# Patient Record
Sex: Male | Born: 2000 | Race: White | Hispanic: No | Marital: Single | Smoking: Never smoker
Health system: Southern US, Community
[De-identification: ages and names within clinical notes are randomized; demographics above are authoritative.]

## PROBLEM LIST (undated history)

## (undated) DIAGNOSIS — F840 Autistic disorder: Secondary | ICD-10-CM

## (undated) DIAGNOSIS — F32A Depression, unspecified: Secondary | ICD-10-CM

## (undated) DIAGNOSIS — F329 Major depressive disorder, single episode, unspecified: Secondary | ICD-10-CM

## (undated) DIAGNOSIS — J45909 Unspecified asthma, uncomplicated: Secondary | ICD-10-CM

## (undated) DIAGNOSIS — F419 Anxiety disorder, unspecified: Secondary | ICD-10-CM

## (undated) HISTORY — DX: Major depressive disorder, single episode, unspecified: F32.9

## (undated) HISTORY — PX: EXTERNAL EAR SURGERY: SHX627

## (undated) HISTORY — DX: Anxiety disorder, unspecified: F41.9

## (undated) HISTORY — DX: Depression, unspecified: F32.A

## (undated) HISTORY — DX: Unspecified asthma, uncomplicated: J45.909

## (undated) HISTORY — DX: Autistic disorder: F84.0

---
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
## 2019-01-25 ENCOUNTER — Other Ambulatory Visit (HOSPITAL_COMMUNITY): Payer: Self-pay | Admitting: Psychiatry

## 2019-03-25 ENCOUNTER — Other Ambulatory Visit (HOSPITAL_COMMUNITY): Payer: Self-pay | Admitting: Psychiatry

## 2019-03-25 NOTE — Telephone Encounter (Signed)
PER PROVIDER : Call pt for appt, last seen one year ago (03/24/2018).  LVM REQUESTING CALL BACK TO SCHEDULE APPT & INFORMING UPDATED EVALUATION NEEDED TO CONTINUE MEDICATION REFILLS. AND REMINDED OF NO SHOW POLICY

## 2019-03-25 NOTE — Telephone Encounter (Signed)
Call pt for appt, last seen one year ago

## 2019-04-19 ENCOUNTER — Other Ambulatory Visit (HOSPITAL_COMMUNITY): Payer: Self-pay | Admitting: Psychiatry

## 2019-04-20 NOTE — Telephone Encounter (Signed)
CALLED [facility] PER PROVIDER : Call pt for appt, no refill until appt made.

## 2019-04-20 NOTE — Telephone Encounter (Signed)
Call pt for appt, no refill until appt made

## 2019-06-06 ENCOUNTER — Other Ambulatory Visit (HOSPITAL_COMMUNITY): Payer: Self-pay | Admitting: Psychiatry

## 2019-07-09 ENCOUNTER — Other Ambulatory Visit (HOSPITAL_COMMUNITY): Payer: Self-pay | Admitting: Psychiatry

## 2019-08-09 ENCOUNTER — Other Ambulatory Visit (HOSPITAL_COMMUNITY): Payer: Self-pay | Admitting: Psychiatry

## 2019-08-09 NOTE — Telephone Encounter (Signed)
Call pt for appt 

## 2019-08-10 ENCOUNTER — Encounter (HOSPITAL_COMMUNITY): Payer: Self-pay | Admitting: *Deleted

## 2019-08-10 NOTE — Telephone Encounter (Signed)
LMOM

## 2019-09-27 ENCOUNTER — Other Ambulatory Visit (HOSPITAL_COMMUNITY): Payer: Self-pay | Admitting: Psychiatry

## 2019-12-11 ENCOUNTER — Other Ambulatory Visit (HOSPITAL_COMMUNITY): Payer: Self-pay | Admitting: Psychiatry
